# Patient Record
Sex: Male | Born: 1970 | Race: Black or African American | Hispanic: No | Marital: Married | Smoking: Never smoker
Health system: Southern US, Community
[De-identification: ages and names within clinical notes are randomized; demographics above are authoritative.]

## PROBLEM LIST (undated history)

## (undated) DIAGNOSIS — K219 Gastro-esophageal reflux disease without esophagitis: Secondary | ICD-10-CM

## (undated) DIAGNOSIS — G4730 Sleep apnea, unspecified: Secondary | ICD-10-CM

## (undated) DIAGNOSIS — I10 Essential (primary) hypertension: Secondary | ICD-10-CM

## (undated) HISTORY — DX: Essential (primary) hypertension: I10

## (undated) HISTORY — DX: Sleep apnea, unspecified: G47.30

## (undated) HISTORY — DX: Gastro-esophageal reflux disease without esophagitis: K21.9

---

## 1998-05-20 DIAGNOSIS — I10 Essential (primary) hypertension: Secondary | ICD-10-CM

## 1998-05-20 HISTORY — DX: Essential (primary) hypertension: I10

## 2003-05-21 DIAGNOSIS — G4730 Sleep apnea, unspecified: Secondary | ICD-10-CM

## 2003-05-21 HISTORY — DX: Sleep apnea, unspecified: G47.30

## 2007-06-29 ENCOUNTER — Inpatient Hospital Stay (HOSPITAL_COMMUNITY): Admission: EM | Admit: 2007-06-29 | Discharge: 2007-07-01 | Payer: Self-pay | Admitting: Emergency Medicine

## 2007-09-06 ENCOUNTER — Encounter: Admission: RE | Admit: 2007-09-06 | Discharge: 2007-09-06 | Payer: Self-pay | Admitting: Cardiology

## 2009-04-26 IMAGING — CR DG CHEST 1V PORT
1 series · 1 of 1 positions shown · non-contrast
Comparison: none

CLINICAL DATA: Congestion.  Chest pain.  Short of breath. 
 PORTABLE CHEST - 1 VIEW - 06/29/07 AT 0959 HOURS:

[AP]
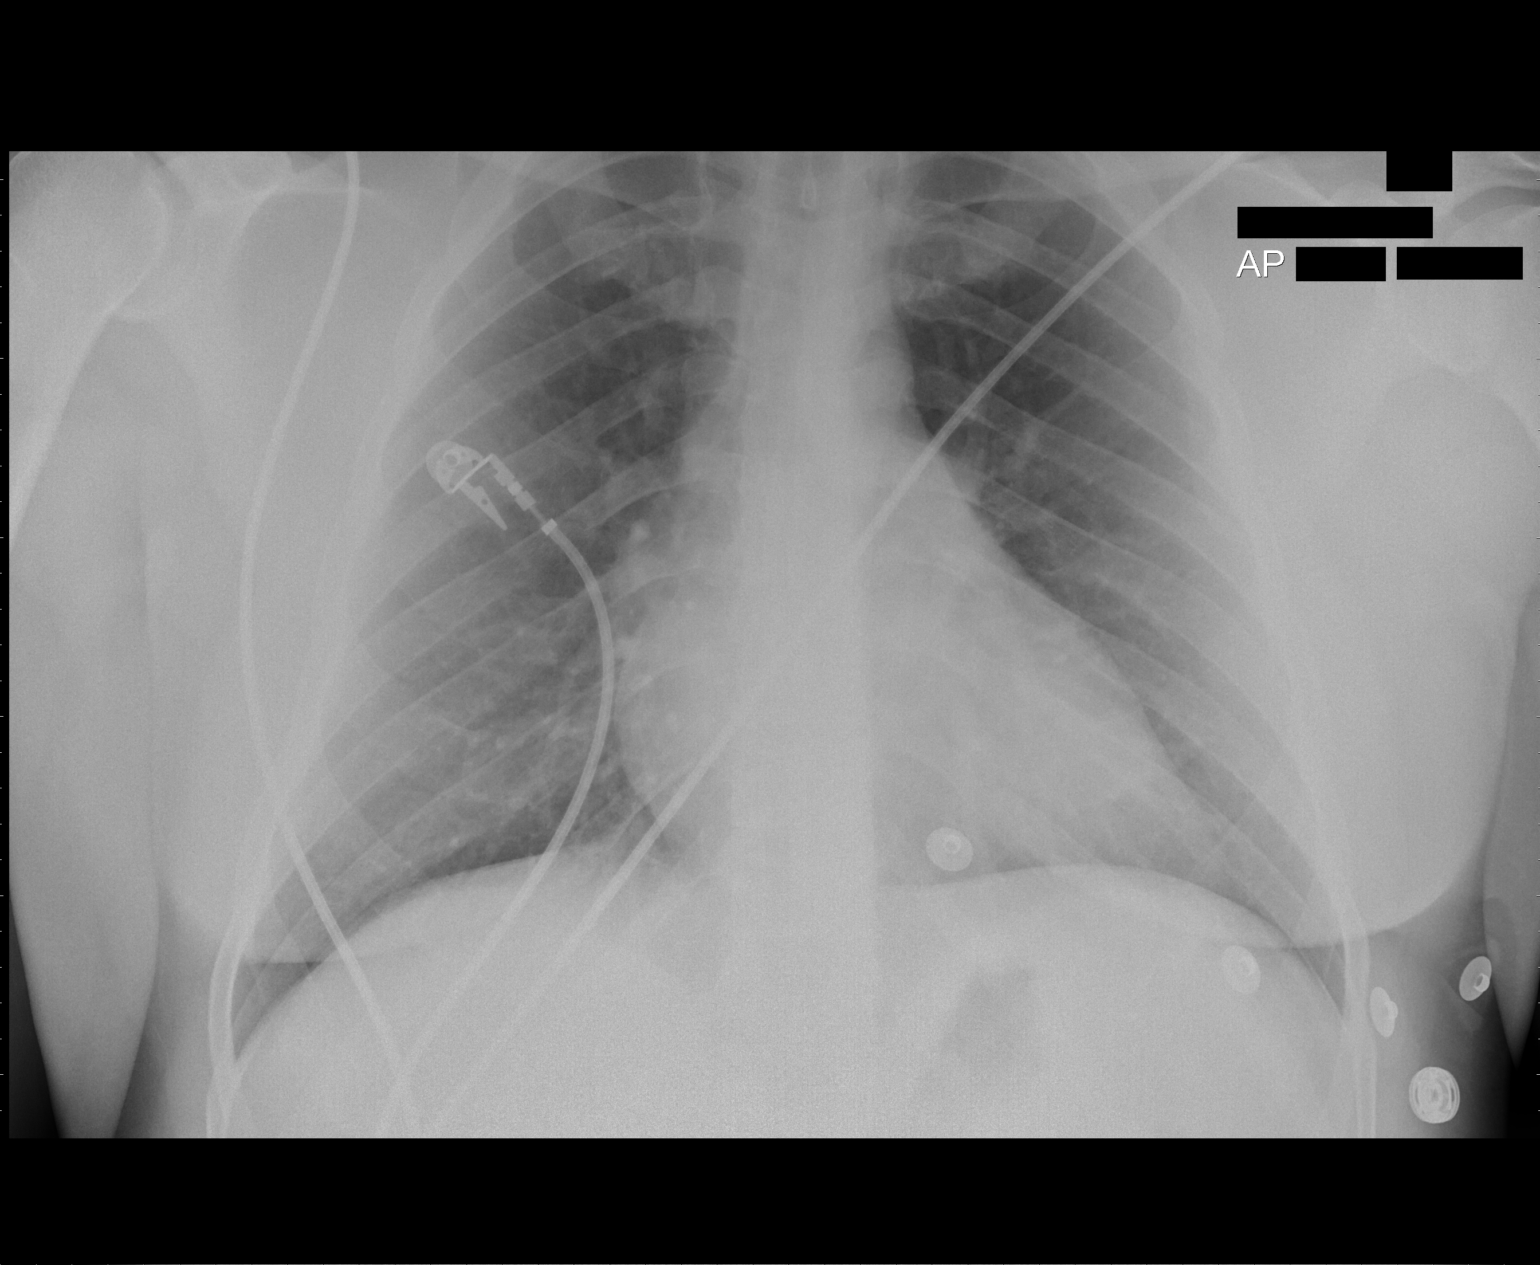

[1 of 1 positions shown; findings below may reference images not displayed]

FINDINGS: The lungs are clear.   The heart is within normal limits in size.  No bony abnormality is seen.
IMPRESSION: No active lung disease.

## 2009-04-27 IMAGING — US US RENAL
1 series · 14 of 25 positions shown · non-contrast
Comparison: none

CLINICAL DATA: Right renal atrophy.  Hypertension. 
 RENAL/URINARY TRACT ULTRASOUND:
TECHNIQUE: Complete ultrasound examination of the urinary tract was performed including evaluation of the kidneys, renal collecting systems, and urinary bladder.

[Series 1: unknown · 0.38mm/px · 14 of 30 slices shown]
[im 1/30]
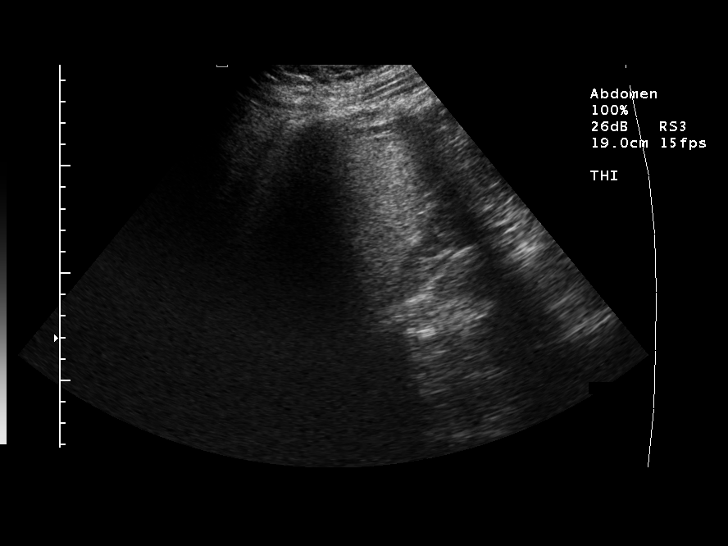
[im 3/30]
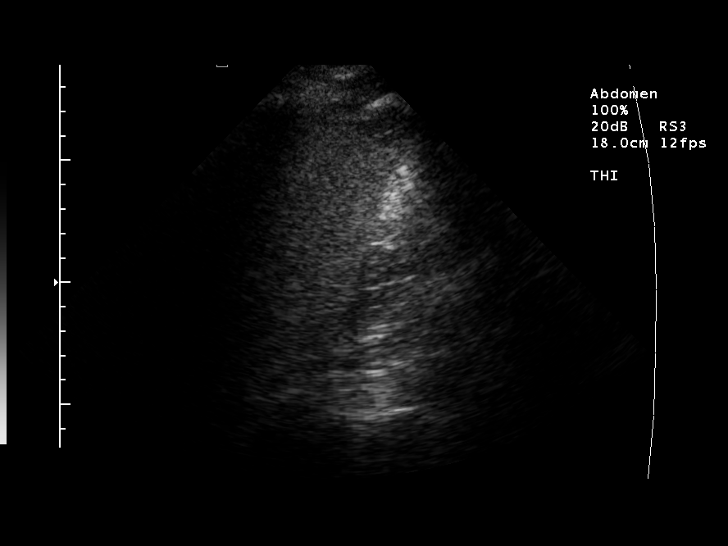
[im 5/30]
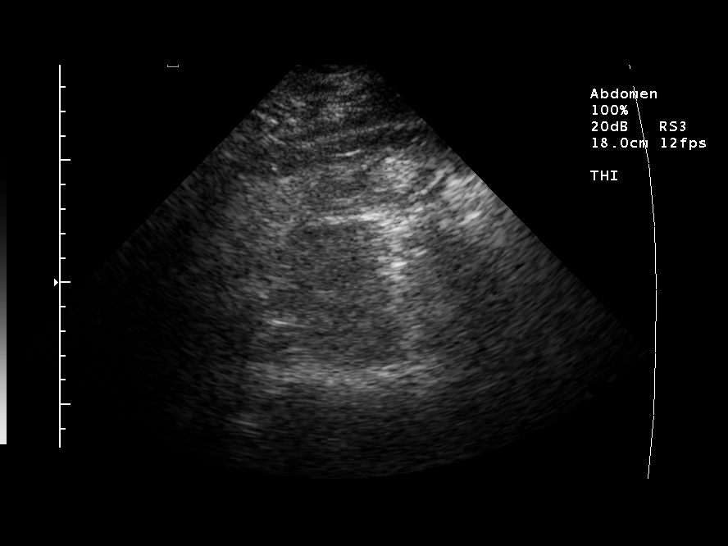
[im 8/30]
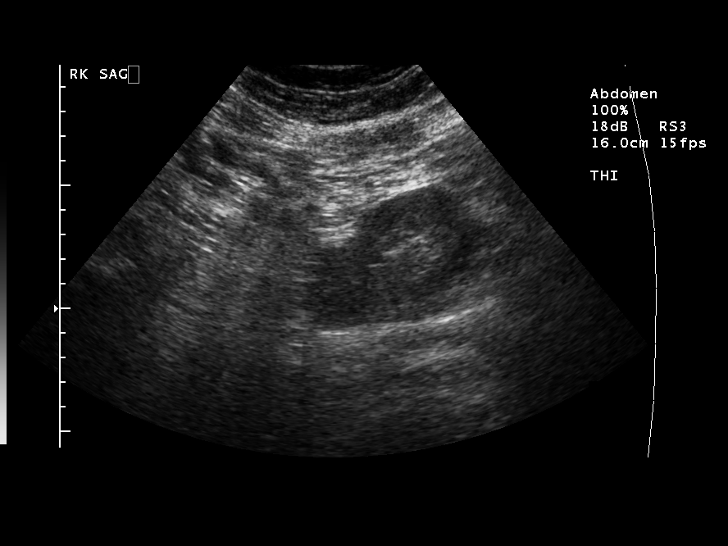
[im 10/30]
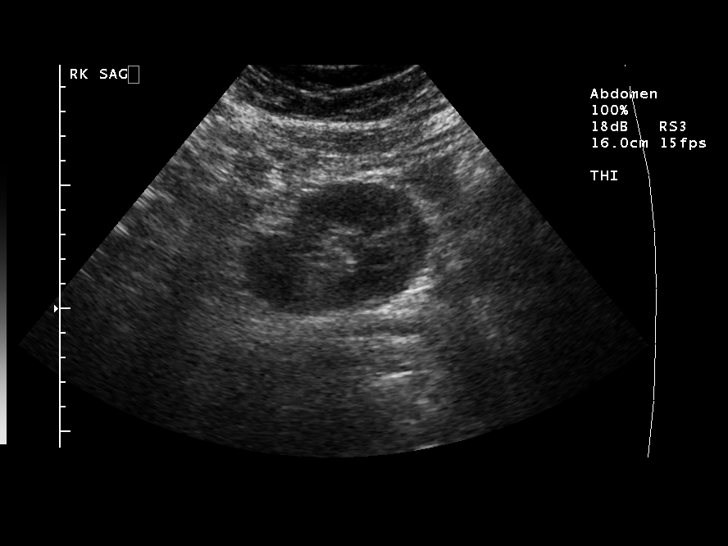
[im 11/30]
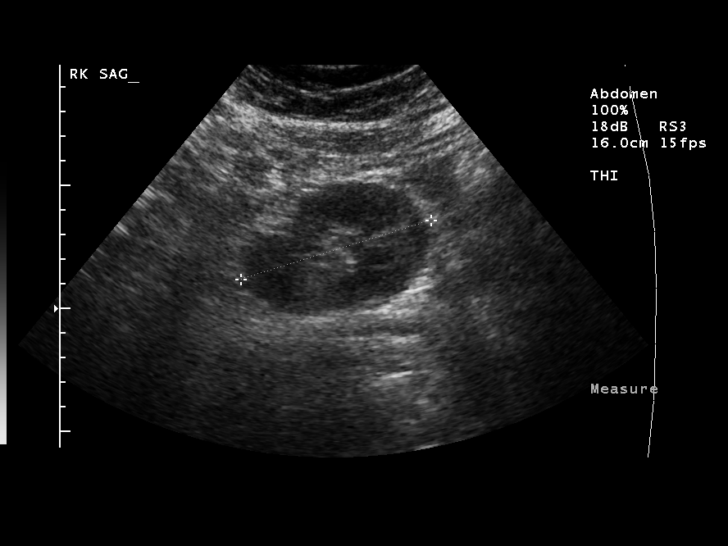
[im 14/30]
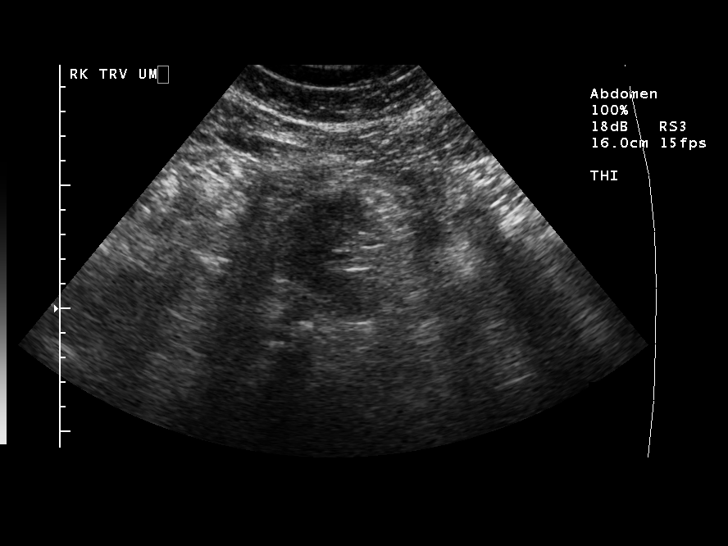
[im 16/30]
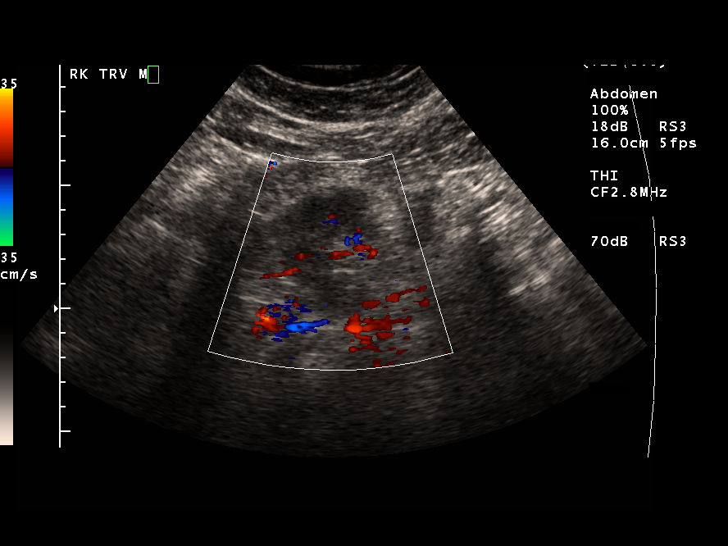
[im 19/30]
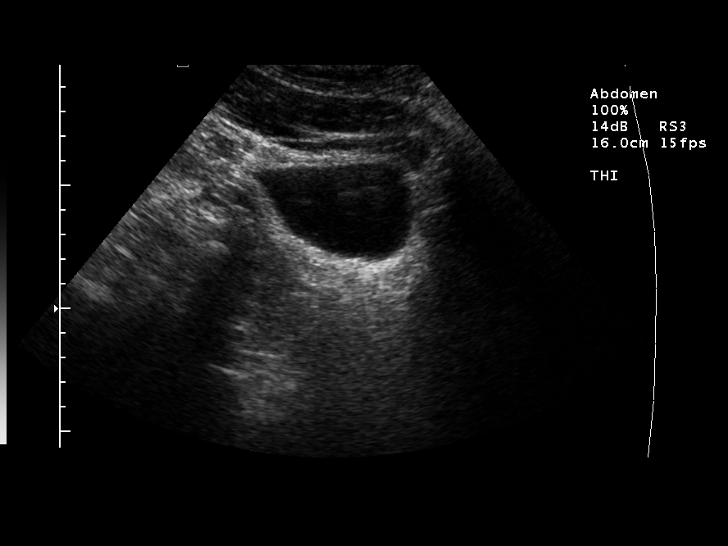
[im 20/30]
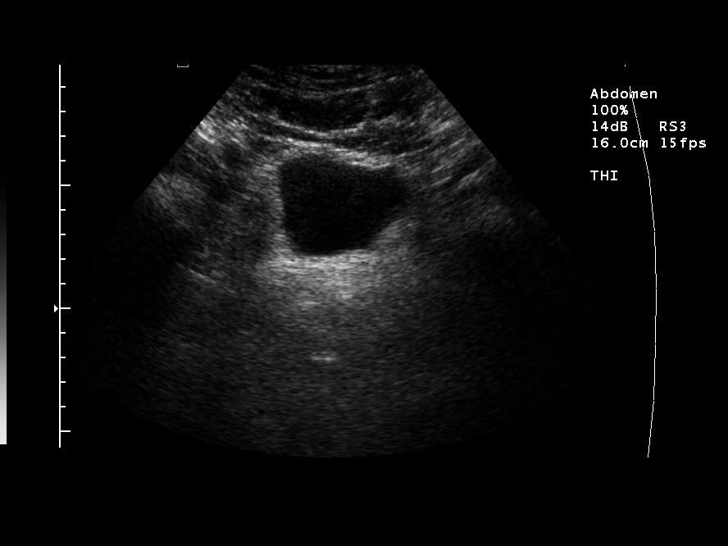
[im 22/30]
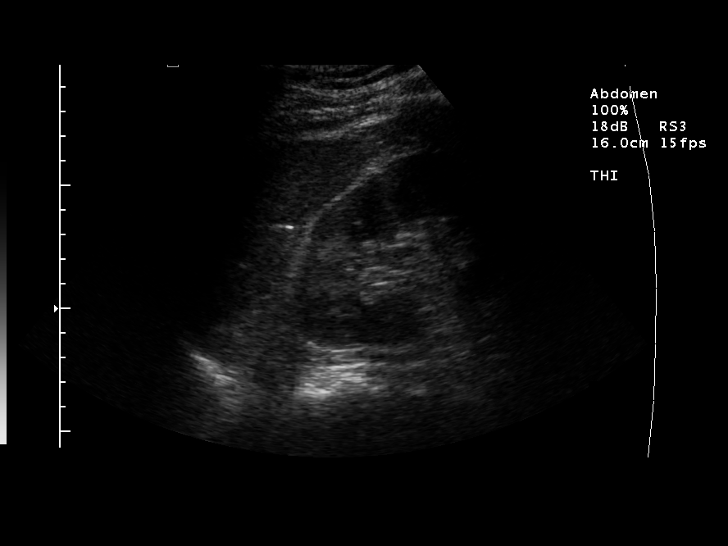
[im 25/30]
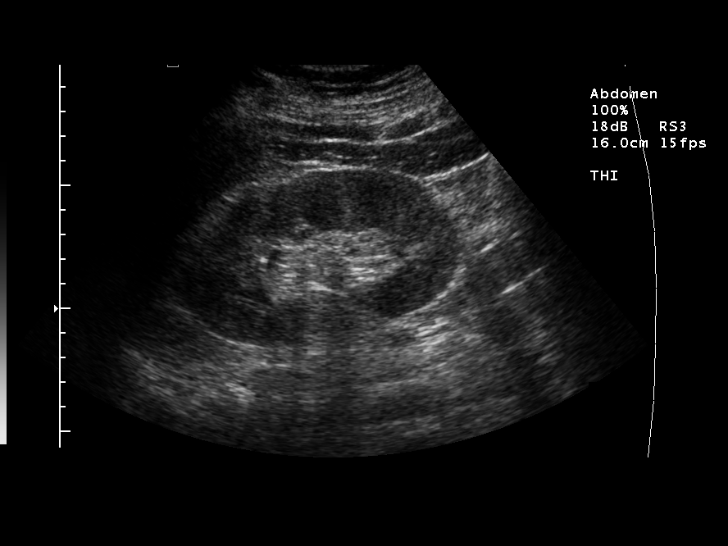
[im 27/30]
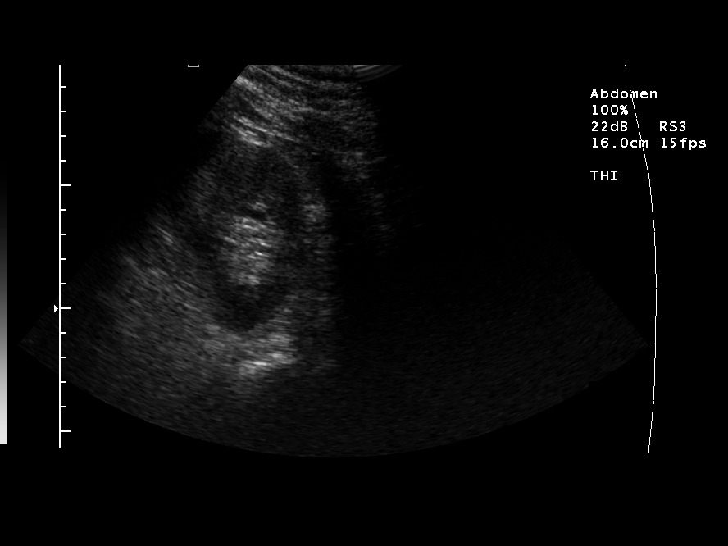
[im 30/30]
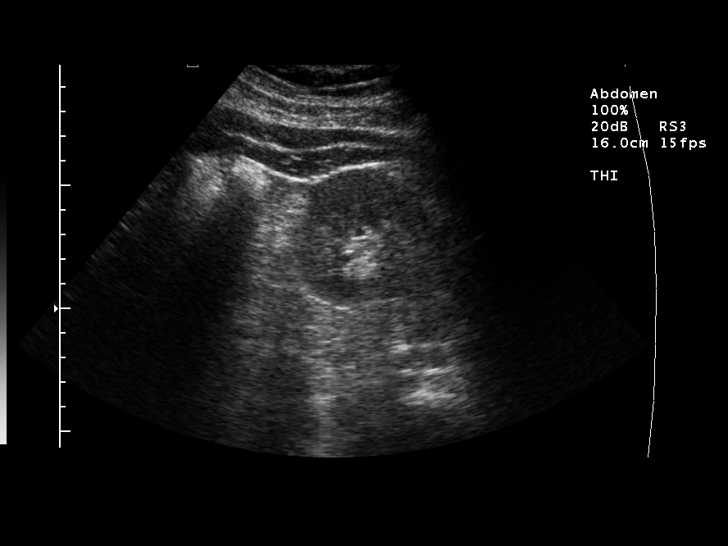

[14 of 25 positions shown; findings below may reference images not displayed]

FINDINGS: The left kidney appears normal in size measuring 11.7 cm in length.  Echogenicity is normal.  No hydronephrosis or focal lesions.  
 The right kidney measures 8.1 cm in length and is present in the right pelvis.  No hydronephrosis or focal lesion. 
 The bladder is unremarkable.
IMPRESSION: Right kidney in a pelvic location.  The organ is slightly small, but shows normal echogenicity and does not show any focal abnormality.  Left kidney appears normal.

## 2009-07-26 ENCOUNTER — Ambulatory Visit: Payer: Self-pay | Admitting: Family Medicine

## 2009-08-09 ENCOUNTER — Ambulatory Visit: Payer: Self-pay | Admitting: Family Medicine

## 2010-10-02 NOTE — Discharge Summary (Signed)
NAME:  Joe Herring, Joe Herring NO.:  [id number]   MEDICAL RECORD NO.:  [id number]          PATIENT TYPE:  INP   LOCATION:  5004                         FACILITY:  [facility]   PHYSICIAN:  Cristy Hilts. Jacinto Halim, MD       DATE OF BIRTH:  12-Dec-1970   DATE OF ADMISSION:  06/29/2007  DATE OF DISCHARGE:  07/02/2007                               DISCHARGE SUMMARY   HISTORY:  40 year old patient, new to Dr. Jacinto Halim, presented to  the emergency room at [facility] on June 29, 2007 secondary to chest pain.  He has no prior cardiac history.  He has been hypertensive since 1996  and presented with chest pain which began the night before, midsternal  chest pressure with radiation to his back that increased with walking  and resolved after rest initially but would come during the night and  awaken him from sleep.  By the morning of June 29, 2007 he was short  of breath.  In the emergency room he was given aspirin, IV nitroglycerin  with relief of his symptoms.  He was pain-free on IV nitroglycerin.  Because of the symptoms, his hypertension, he also has a history of  obstructive sleep apnea but does not wear CPAP, it was felt he should  undergo cardiac catheterization.   He was admitted to the telemetry unit.   PAST MEDICAL HISTORY:  1. Hypertension, treated with Norvasc.  2. He has obstructive sleep apnea with CPAP but only wears it rarely      or he did on admission.   ALLERGIES:  CODEINE AND PERCOCET.  No fish or iodine allergy.  Patient  can take Tylenol without any allergic reaction but no CODEINE.   OUTPATIENT MEDICATIONS:  Norvasc 5 daily.   SOCIAL HISTORY:  Married, three children ages 92, 55 and 67 months.  No  tobacco, alcohol or illicit drugs. He does exercise two days a week.  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   FAMILY HISTORY:  Mother is alive and well.  Father alive with  hypertension.  Two brothers; one with  diabetes, one alive and well.  One  sister alive and well.  No coronary disease in his family .   REVIEW OF SYSTEMS:  See H and P.   PHYSICAL EXAMINATION AT DISCHARGE:  VITAL SIGNS:  Blood pressure 158/98  to 128/75.  Medications adjusted. Pulse 70.  Respirations 18.  Temperature 97.6.  Oxygen saturation on CPAP was 98%.  HEART:  S1, S2, regular rate and rhythm.  LUNGS: Clear.  GROIN:  Stable.   LABORATORY DATA:  Hemoglobin 13.2, hematocrit 41.5, white blood cell  count 5.6, platelet count 219,000, MCV 68.7.  Sodium 132, potassium 4.1,  chloride 102, CO2 26.  BUN 12, creatinine 1.34, glucose 125.  Coag's  with PT 12.9, INR 1.0, PTT 29.  Heparin 0.64.  AST 32, ALT 33, alkaline  phosphatase 113, total bilirubin 1.0, amylase 114, lipase 16, albumin  3.9.   Cardiac enzymes:  CK's were all elevated - 388, 355 and  305 though MB's  were all negative - 1.9, 2.4, 2.2 and troponin I's were all negative -  less than 0.01.   Total cholesterol 112, LDL 56, HDL 39, triglycerides 83.  Calcium 8.8,  magnesium 2.1.   D-dimer less than 0.22.  BNP less than 30.  Glycosylated hemoglobin was  elevated at 7.0.  TSH was within normal limits at 0.639.   Chest x-ray - no active lung disease.   PROCEDURE:  June 30, 2007 combined left heart catheterization by Dr.  Madaline Savage revealing patent coronary arteries, no MR.  Ejection  fraction was approximately 55%.   Please note, he did have an atrophied right kidney when his renal  arteries were looked at.   DISCHARGE MEDICATIONS:  1. Lotrel 5/20, one daily, new medication.  2. Fish oil two capsules daily flow low good cholesterol level.  3. Aspirin 81 mg daily.  4. CPAP at night.   DISCHARGE INSTRUCTIONS:  1. Do not return to work until July 06, 2007.  Work note given.  2. Low sodium, heart-healthy diabetic diet.  3. Increase activity slowly.  May shower or bathe.  No lifting for two      days.  No driving for two days.  4. Wash  right groin cath site with soap and water.  Call if any      bleeding, swelling or drainage.  5. Follow up with Dr. Jacinto Halim in two weeks.  The office will call with      date and time.  6. Follow up with [facility] for your diabetes.  7. You will be scheduled for a renal ultrasound at [facility].   DISCHARGE DIAGNOSES:  1. Chest pain secondary to musculoskeletal with elevated CK's but      negative MB's and patent coronary arteries as well as negative      amylase and lipase.  2. Hypertension, improved control adding ACE inhibitor to his Norvasc.  3. Obstructive sleep apnea.  Use CPAP.  4. New - diabetes mellitus type 2.  To follow up with primary care      physician.  Instructed on diet.  5. Atrophied right kidney, only one functional kidney.  6. Dyslipidemia.      Darcella Gasman. [place], N.P.      Cristy Hilts. Jacinto Halim, MD  Electronically Signed    LRI/MEDQ  D:  07/01/2007  T:  07/02/2007  Job:  [id number]   cc:   [facility]  Madaline Savage, M.D.  [facility]

## 2010-10-02 NOTE — Cardiovascular Report (Signed)
NAME:  Joe Herring, RUNK NO.:  [id number]   MEDICAL RECORD NO.:  [id number]          PATIENT TYPE:  INP   LOCATION:  3728                         FACILITY:  [facility]   PHYSICIAN:  Madaline Savage, M.D.DATE OF BIRTH:  04-26-1971   DATE OF PROCEDURE:  06/30/2007  DATE OF DISCHARGE:                            CARDIAC CATHETERIZATION   PROCEDURES PERFORMED:  1. Selective coronary angiography by Judkins technique.  2. Retrograde left heart catheterization.  3. Left ventricular angiography.  4. Nonselective suprarenal aortography.  5. Selective renal angiography of the right and left kidneys.   INTERVENTIONS:  None.   COMPLICATIONS:  None.   PATIENT PROFILE:  Joe Herring is a 40 year old married African-American  gentleman who was sent to the [facility] emergency room on November 14  for chest pain.  He takes Norvasc as an outpatient and has a history of  hypertension with allergies to Percocet.  During his stay in the  hospital, he had negative enzymes for myocardial infarction.  His EKG  showed sinus rhythm at a rate of 73 a minute with some mild early  repolarization changes of the ST segment in leads V2-V4 with no old  tracings to compare.  The patient presented to the cath lab today for  cardiac catheterization considering his risk factors, which include  obesity, obstructive sleep apnea, abnormal EKG and chest pain.   The procedure was performed electively from the right percutaneous  femoral artery approach.  No complications occurred.  Results are  described below.   Pressures:  Left ventricular pressure was 130/18.  Central aortic  pressure 124/90 with a mean of 105.  Left ventricular end-diastolic  pressure 26.   Angiographic report:  The coronary arteries were angiographically patent  and consisted of a large dominant left circumflex coronary artery,  giving rise to 2 proximal obtuse marginals, then a medium-sized obtuse  marginal branch, then a large  posterior descending branch that  bifurcated distally, followed by yet another posterolateral branch.  The  left anterior descending coronary artery consisted of a single diagonal  branch arising after the first septal perforator, and both diagonal and  LAD were normal.   Right coronary artery was small and nondominant with no evidence of RCA  stenosis.  The left ventricular angiogram showed normal contractility  with ejection fraction estimates at 60%.  There was no evidence of  mitral regurgitation, LV mass or any evidence of aortic root pathology.  Abdominal aorta showed no calcification but did show an atrophic mass in  the area of the right renal artery.  There was  a nephrogram of the left  kidney showing that calyces were present and that the upper ureter  appeared normal and the left renal artery size appeared grossly normal.  The left renal arteries were small and atrophic.  There looked to be 2  renal arteries, and there was never a nephrogram phase for the right  kidney and that kidney appeared atrophic and granular in appearance.  There was selective injection into what was actually a superior  mesenteric, and that vessel and its branches were all  normal.   FINAL IMPRESSIONS:  1. Angiographically patent coronary arteries with a left coronary      dominant system.  2. Normal left ventricular systolic function.  3. Normal left renal artery and normal left renal nephrogram.  4. Atrophic right kidney with what appears to be small dual renal      arteries that were atrophic.   PLAN:  The patient will have a renal ultrasound today and hopefully be  discharged if there is no new information that is noted from his renal  ultrasound.  Follow up with Dr. Yates Decamp.           ______________________________  Madaline Savage, M.D.     WHG/MEDQ  D:  06/30/2007  T:  07/01/2007  Job:  [id number]

## 2011-02-08 LAB — BASIC METABOLIC PANEL
BUN: 12
CO2: 26
Chloride: 102
GFR calc non Af Amer: 60 — ABNORMAL LOW
Glucose, Bld: 125 — ABNORMAL HIGH
Potassium: 4.1
Sodium: 132 — ABNORMAL LOW

## 2011-02-08 LAB — POCT CARDIAC MARKERS
CKMB, poc: 1
Myoglobin, poc: 78.9
Operator id: 151321
Operator id: 288831
Troponin i, poc: <0.05
Troponin i, poc: <0.05

## 2011-02-08 LAB — COMPREHENSIVE METABOLIC PANEL
ALT: 33
AST: 32
Alkaline Phosphatase: 113
CO2: 27
Calcium: 9.4
GFR calc Af Amer: >60
Glucose, Bld: 109 — ABNORMAL HIGH
Potassium: 4.1
Sodium: 140
Total Protein: 7.5

## 2011-02-08 LAB — CK TOTAL AND CKMB (NOT AT ARMC)
CK, MB: 2.2
Total CK: 305 — ABNORMAL HIGH
Total CK: 388 — ABNORMAL HIGH

## 2011-02-08 LAB — DIFFERENTIAL
Basophils Relative: 0
Eosinophils Absolute: 0.1
Eosinophils Relative: 2
Lymphs Abs: 1.6
Monocytes Absolute: 0.4
Neutro Abs: 3
Neutrophils Relative %: 60

## 2011-02-08 LAB — LIPASE, BLOOD: Lipase: 16

## 2011-02-08 LAB — LIPID PANEL
Cholesterol: 112
HDL: 39 — ABNORMAL LOW
LDL Cholesterol: 56
Total CHOL/HDL Ratio: 2.9

## 2011-02-08 LAB — POCT I-STAT CREATININE
Creatinine, Ser: 1.3
Operator id: 288831

## 2011-02-08 LAB — MAGNESIUM: Magnesium: 2.1

## 2011-02-08 LAB — CBC
HCT: 43.5
Hemoglobin: 13.8
MCHC: 31.8
MCHC: 31.9
MCV: 68.4 — ABNORMAL LOW
MCV: 68.7 — ABNORMAL LOW
Platelets: 219
RBC: 6.35 — ABNORMAL HIGH

## 2011-02-08 LAB — HEPARIN LEVEL (UNFRACTIONATED): Heparin Unfractionated: 0.64

## 2011-02-08 LAB — I-STAT 8, (EC8 V) (CONVERTED LAB)
Glucose, Bld: 129 — ABNORMAL HIGH
HCT: 50
TCO2: 27
pCO2, Ven: 44.1 — ABNORMAL LOW
pH, Ven: 7.367 — ABNORMAL HIGH

## 2011-02-08 LAB — APTT: aPTT: 105 — ABNORMAL HIGH

## 2011-02-08 LAB — TSH: TSH: 0.639

## 2011-02-08 LAB — TROPONIN I
Troponin I: <0.01
Troponin I: <0.01

## 2011-02-08 LAB — B-NATRIURETIC PEPTIDE (CONVERTED LAB): Pro B Natriuretic peptide (BNP): <30

## 2011-02-08 LAB — D-DIMER, QUANTITATIVE: D-Dimer, Quant: <0.22

## 2011-03-26 ENCOUNTER — Encounter: Payer: Self-pay | Admitting: Family Medicine

## 2016-04-25 ENCOUNTER — Encounter: Payer: Self-pay | Admitting: Family Medicine

## 2016-05-08 ENCOUNTER — Encounter: Payer: Self-pay | Admitting: Family Medicine

## 2016-05-08 ENCOUNTER — Ambulatory Visit (INDEPENDENT_AMBULATORY_CARE_PROVIDER_SITE_OTHER): Payer: BLUE CROSS/BLUE SHIELD | Admitting: Family Medicine

## 2016-05-08 VITALS — BP 152/68 | HR 78 | Temp 97.6°F | Wt 230.0 lb

## 2016-05-08 DIAGNOSIS — E119 Type 2 diabetes mellitus without complications: Secondary | ICD-10-CM

## 2016-05-08 DIAGNOSIS — Z7689 Persons encountering health services in other specified circumstances: Secondary | ICD-10-CM

## 2016-05-08 DIAGNOSIS — I10 Essential (primary) hypertension: Secondary | ICD-10-CM | POA: Diagnosis not present

## 2016-05-08 LAB — CBC WITH DIFFERENTIAL/PLATELET
BASOS ABS: 0 {cells}/uL (ref 0–200)
Basophils Relative: 0 %
EOS PCT: 2 %
Eosinophils Absolute: 98 cells/uL (ref 15–500)
HEMATOCRIT: 46.5 % (ref 38.5–50.0)
HEMOGLOBIN: 14.4 g/dL (ref 13.2–17.1)
LYMPHS ABS: 2107 {cells}/uL (ref 850–3900)
LYMPHS PCT: 43 %
MCH: 21.6 pg — AB (ref 27.0–33.0)
MCHC: 31 g/dL — AB (ref 32.0–36.0)
MCV: 69.8 fL — ABNORMAL LOW (ref 80.0–100.0)
MPV: 9.8 fL (ref 7.5–12.5)
Monocytes Absolute: 294 cells/uL (ref 200–950)
Monocytes Relative: 6 %
NEUTROS PCT: 49 %
Neutro Abs: 2401 cells/uL (ref 1500–7800)
Platelets: 189 10*3/uL (ref 140–400)
RBC: 6.66 MIL/uL — ABNORMAL HIGH (ref 4.20–5.80)
RDW: 16.1 % — AB (ref 11.0–15.0)
WBC: 4.9 10*3/uL (ref 3.8–10.8)

## 2016-05-08 LAB — COMPLETE METABOLIC PANEL WITH GFR
ALBUMIN: 4.2 g/dL (ref 3.6–5.1)
ALK PHOS: 108 U/L (ref 40–115)
ALT: 18 U/L (ref 9–46)
AST: 22 U/L (ref 10–40)
BUN: 11 mg/dL (ref 7–25)
CALCIUM: 9.2 mg/dL (ref 8.6–10.3)
CO2: 26 mmol/L (ref 20–31)
Chloride: 104 mmol/L (ref 98–110)
Creat: 1.34 mg/dL (ref 0.60–1.35)
GFR, EST NON AFRICAN AMERICAN: 63 mL/min (ref >=60)
GFR, Est African American: 73 mL/min (ref >=60)
Glucose, Bld: 158 mg/dL — ABNORMAL HIGH (ref 65–99)
POTASSIUM: 4.7 mmol/L (ref 3.5–5.3)
SODIUM: 140 mmol/L (ref 135–146)
Total Bilirubin: 0.6 mg/dL (ref 0.2–1.2)
Total Protein: 7.1 g/dL (ref 6.1–8.1)

## 2016-05-08 LAB — LIPID PANEL
CHOL/HDL RATIO: 1.9 ratio (ref <5.0)
CHOLESTEROL: 94 mg/dL (ref <200)
HDL: 49 mg/dL (ref >40)
LDL Cholesterol: 34 mg/dL (ref <100)
Triglycerides: 54 mg/dL (ref <150)
VLDL: 11 mg/dL (ref <30)

## 2016-05-08 LAB — TSH: TSH: 0.5 mIU/L (ref 0.40–4.50)

## 2016-05-08 MED ORDER — ASPIRIN EC 81 MG PO TBEC: 81.0000 mg | DELAYED_RELEASE_TABLET | Freq: Every day | ORAL | 3 refills | Status: DC

## 2016-05-08 NOTE — Progress Notes (Signed)
Subjective:    Patient ID: Joe Herring, male    DOB: 09-24-1970, 45 y.o.   MRN: [id number]  CC: Patient establishing care  Well Adult Physical  Patient here for a comprehensive physical exam.The patient reports no problems  Do you take any herbs or supplements that were not prescribed by a doctor? no  Are you taking calcium supplements? no  Are you taking aspirin daily? no  GU History: Patient did not receive prostate care outside our clinic Date last prostate exam: N/A, patient <50 Date last PSA: N/A   Family history: Family History  Problem Relation Age of Onset  . Hypertension Father   . Cancer Maternal Grandmother   . Diabetes Maternal Grandmother   . Hypertension Maternal Grandmother   . Alcohol abuse Maternal Aunt    @FAM @  Screening Cancer Colon Cancer- No Breast Cancer-No Cervical cancer-No Prostate cancer- Father and uncle Lung cancer- Grandmother Skin Cancer-No  Chronic dis HTN-Father AAA-No Diabetes-Grandmother Hyperlipidemia-No Osteoporosis-No  Infection HCV-No STI-No  Psych/Social Depression-No EtOH abuse-No Tobacco use-No Drug use: No Work: Exercise: weight lifting  Diet: Regular, generally balanced Sexual activity: married, monogamy with wife Birth control: n/a Intimate partner violence: feels safe in relationship Review of Systems  Constitutional: Negative.   HENT: Positive for tinnitus.   Eyes: Negative.   Respiratory: Negative.   Cardiovascular: Negative.   Gastrointestinal: Negative.   Endocrine: Negative.   Genitourinary: Negative.   Musculoskeletal: Negative.   Skin: Negative.   Allergic/Immunologic: Negative.   Neurological: Negative.   Hematological: Negative.   Psychiatric/Behavioral: Negative.    Objective:   Physical Exam Vitals:   05/08/16 0855  BP: (!) 158/72  Pulse: 78  Temp: 97.6 F (36.4 C)  TempSrc: Oral  SpO2: 98%  Weight: 230 lb (104.3 kg)    General: Alert and oriented. Pleasant and  cooperative. Well-nourished and well-developed.  Head: Normocephalic and atraumatic. Eyes: Without icterus, sclera clear and conjunctiva pink.  Ears: Normal auditory acuity. TMs and ear canals normal Cardiovascular: S1, S2 present without murmurs. Extremities without clubbing or edema. Respiratory: Clear to auscultation bilaterally. No wheezes, rales, or rhonchi. No distress.  Gastrointestinal: +BS, soft, non-tender and non-distended. No HSM noted. No guarding or rebound. No masses appreciated.  Genitourinary: Deferred  Musculoskalatal: Symmetrical without gross deformities. Skin: Intact without significant lesions or rashes. Neurologic: Alert and oriented x4; grossly normal. Psych: Alert and cooperative. Normal mood and affect. Heme/Lymph/Immune: No excessive bruising noted     Assessment & Plan:   #Health Maintenance/ Establishing Care with practice Patient is new to the practice and appears to be in good health at presentation with no major concerns except for occasional tinnitus, and current weight. Patient is 45 yo, with a previous diagnosis of T2DM in 2009 with a A1c of 7.0. Patient was started on metformin but has not taken it since 2015. Patient also diagnosed with hypertension and was on amlodipine until recently when he stopped taking it. Today BP is elevated at 158/72 with a repeat BP 152/68.  Patient was previously hospitalized in 2009 [facility] at [facility] for chest pain and had extensive workup including catheterization that was essentially within normal limit. Patient is currently 230 lb and would like to lose more weight and understand with diabetes and hypertension it is primordial to control his weight. Patient will increase physical activity and plan to modify diet to better fit current therapy goals. Patient has concerned with prostate cancer but discussed screening guidelines with good understanding from the patient. I will order  baseline lab work today and will tailor  treatment plan based on results.  --Will order CBC, CMP, Lipid panel, TSH and A1c --Will start patient on 81 mg ASA given cardiac history, risks factor and age --Will restart metformin 1000 mg pending A1c --Will initially start patient on 10 mg daily, with BP check in 10 days and titration as needed --Patient will also benefit from statin therapy given cardiac history, and comorbidity. Will start patient on statin therapy and assess optimal dosage and drug based on lipid panel results.

## 2016-05-08 NOTE — Patient Instructions (Addendum)
It was great seeing you today! We have addressed the following issues today  1. We will check basic lab work today ( CBC, CMP, A1c, TSH, Lipid panel ). I will call you tomorrow with the results. 2. I will start you today on aspirin 81 mg once a day 3. Make an appointment to see each other in about a month at the front desk.   If we did any lab work today, and the results require attention, either me or my nurse will get in touch with you. If everything is normal, you will get a letter in mail. If you don't hear from us in two weeks, please give us a call. Otherwise, we look forward to seeing you again at your next visit. If you have any questions or concerns before then, please call the clinic at [phone].   Please bring all your medications to every doctors visit   Sign up for My Chart to have easy access to your labs results, and communication with your Primary care physician.     Please check-out at the front desk before leaving the clinic.    Take Care,

## 2016-05-09 LAB — HEMOGLOBIN A1C
Hgb A1c MFr Bld: 7.4 % — ABNORMAL HIGH (ref <5.7)
Mean Plasma Glucose: 166 mg/dL

## 2016-05-09 MED ORDER — METFORMIN HCL 1000 MG PO TABS: 1000.0000 mg | ORAL_TABLET | Freq: Every day | ORAL | 3 refills | Status: DC

## 2016-05-09 MED ORDER — LISINOPRIL 20 MG PO TABS: 20.0000 mg | ORAL_TABLET | Freq: Every day | ORAL | 3 refills | Status: DC

## 2016-05-09 NOTE — Addendum Note (Signed)
Addended by: Lovena NeighboursIALLO, Noor Witte F on: 05/09/2016 04:36 PM   Modules accepted: Orders

## 2016-05-10 ENCOUNTER — Telehealth: Payer: Self-pay | Admitting: *Deleted

## 2016-05-10 NOTE — Telephone Encounter (Signed)
Patient made aware of results and already has a follow up appointment with PCP in January.  Jazmin Hartsell,CMA

## 2016-05-10 NOTE — Telephone Encounter (Signed)
-----   Message from Joe NeighboursAbdoulaye Diallo, MD sent at 05/09/2016  4:34 PM EST ----- Joe MechHey Joe Herring, Please could you let Joe Herring, [name] know that we got the results of the blood work we ordered yesterday. I tried to call him but it went to voicemail. His lipid panel (i.e. Cholesterol) is within the normal range so we will not start him on a statin as yet. I already send metformin 1000 mg to his pharmacy he will take that once a day since his A1c is 7.4 slightly up from 7.0 (8 years ago). I also started him on Lisinopril 20 mg once a day for his elevated blood pressure. His TSH (thyroid), electrolytes and CBC are essentially fine. We will talk about them more at our next visit.  Thank you  Joe NeighboursAbdoulaye Diallo, MD

## 2016-06-13 ENCOUNTER — Ambulatory Visit: Payer: Self-pay | Admitting: Family Medicine

## 2016-07-03 DIAGNOSIS — H524 Presbyopia: Secondary | ICD-10-CM | POA: Diagnosis not present

## 2016-07-12 ENCOUNTER — Ambulatory Visit: Payer: Self-pay | Admitting: Family Medicine

## 2016-08-13 ENCOUNTER — Ambulatory Visit: Payer: Self-pay | Admitting: Family Medicine

## 2016-09-27 ENCOUNTER — Ambulatory Visit: Payer: Self-pay | Admitting: Family Medicine

## 2016-10-29 ENCOUNTER — Ambulatory Visit: Payer: Self-pay | Admitting: Family Medicine

## 2017-07-07 ENCOUNTER — Ambulatory Visit (INDEPENDENT_AMBULATORY_CARE_PROVIDER_SITE_OTHER): Payer: BLUE CROSS/BLUE SHIELD | Admitting: Internal Medicine

## 2017-07-07 ENCOUNTER — Encounter: Payer: Self-pay | Admitting: Internal Medicine

## 2017-07-07 ENCOUNTER — Other Ambulatory Visit: Payer: Self-pay

## 2017-07-07 VITALS — BP 178/94 | HR 93 | Temp 98.7°F | Wt 227.0 lb

## 2017-07-07 DIAGNOSIS — E118 Type 2 diabetes mellitus with unspecified complications: Secondary | ICD-10-CM

## 2017-07-07 DIAGNOSIS — I10 Essential (primary) hypertension: Secondary | ICD-10-CM | POA: Diagnosis not present

## 2017-07-07 DIAGNOSIS — R058 Other specified cough: Secondary | ICD-10-CM

## 2017-07-07 DIAGNOSIS — R05 Cough: Secondary | ICD-10-CM | POA: Diagnosis not present

## 2017-07-07 MED ORDER — BENZONATATE 100 MG PO CAPS: 100.0000 mg | ORAL_CAPSULE | Freq: Three times a day (TID) | ORAL | 0 refills | Status: DC | PRN

## 2017-07-07 MED ORDER — AZELASTINE HCL 0.1 % NA SOLN: 1.0000 | Freq: Two times a day (BID) | NASAL | 0 refills | Status: DC

## 2017-07-07 MED ORDER — LISINOPRIL 20 MG PO TABS: 20.0000 mg | ORAL_TABLET | Freq: Every day | ORAL | 3 refills | Status: DC

## 2017-07-07 NOTE — Patient Instructions (Addendum)
Please stop taking the mucinex that you have been using. As I think it is contributing to your blood pressure being high Please restart Lisinopril for your blood pressure. Please follow up in clinic in 1 week. We will check some blood work today  You can try the nasal spray and Tessalon as needed for your cough and also try tea with honey. The cough can take up to 3 weeks to fully resolve.  We will check your A1c today.   Please follow up in 1 week or so for your blood pressure

## 2017-07-07 NOTE — Progress Notes (Signed)
   [facility] Phone: [phone]   Date of Visit: 07/07/2017   HPI:  Cough: - patient reports of fevers, body aches and cough that began last Tuesday. Fever up to 101F. Fevers resolved on Friday.  -His most bothersome symptom is his cough.  Reports producing a little phlegm. -Has been using Mucinex fast max with last dose taken last night -Has also been using TheraFlu -No shortness of breath, chest pain, headache, blurred vision -Of note his blood pressure is elevated today.  Hypertension: -Has not been seen in clinic since December 2017 when he establish care here -At that time he was started on lisinopril for his blood pressures.  Reports that he has not gotten a refill since that time. -As noted above no shortness of breath, chest pain, headache or blurred vision  Type 2 diabetes: -Last A1c checked was in December 2017 which was 7.4 -At the last visit patient was restarted on metformin 1000 mg twice daily but he ran out of this (this was in December 2017) -No polyuria polydipsia  ROS: See HPI.  PMFSH: Past medical history: Hypertension, diabetes  PHYSICAL EXAM: BP (!) 178/94   Pulse 93   Temp 98.7 F (37.1 C) (Oral)   Wt 227 lb (103 kg)   SpO2 97%  GEN: NAD HEENT: Atraumatic, normocephalic, neck supple, EOMI, sclera clear, tympanic membranes normal bilaterally, oropharynx normal appearing CV: RRR, no murmurs, rubs, or gallops PULM: CTAB, normal effort SKIN: No rash or cyanosis; warm and well-perfused EXTR: No lower extremity edema or calf tenderness PSYCH: Mood and affect euthymic, normal rate and volume of speech NEURO: Awake, alert, no focal deficits grossly, normal speech   ASSESSMENT/PLAN:  Post viral cough syndrome: Fevers have resolved.  No signs or symptoms of pneumonia.  Likely post viral cough.  Discussed cough can last up to 3 weeks.  Tessalon as needed and as a lasting nasal spray for postnasal drip  Type 2 diabetes mellitus  (HCC) Repeat A1c today which will be a send out.  Will await results prior to restarting any medications for diabetes.  Patient is to follow-up to talk about diabetes in more detail at some point  Essential hypertension, benign Has but not been on medications for months.  Will start lisinopril 20 mg today.  BMP today.  Palma HolterKanishka G Arlinda Barcelona, MD PGY 3 [facility]

## 2017-07-08 LAB — HEMOGLOBIN A1C
Est. average glucose Bld gHb Est-mCnc: 252 mg/dL
HEMOGLOBIN A1C: 10.4 % — AB (ref 4.8–5.6)

## 2017-07-08 LAB — BASIC METABOLIC PANEL
BUN/Creatinine Ratio: 7 — ABNORMAL LOW (ref 9–20)
BUN: 10 mg/dL (ref 6–24)
CHLORIDE: 98 mmol/L (ref 96–106)
CO2: 25 mmol/L (ref 20–29)
CREATININE: 1.53 mg/dL — AB (ref 0.76–1.27)
Calcium: 9 mg/dL (ref 8.7–10.2)
GFR calc Af Amer: 62 mL/min/{1.73_m2} (ref >59)
GFR calc non Af Amer: 53 mL/min/{1.73_m2} — ABNORMAL LOW (ref >59)
GLUCOSE: 228 mg/dL — AB (ref 65–99)
Potassium: 4.5 mmol/L (ref 3.5–5.2)
Sodium: 139 mmol/L (ref 134–144)

## 2017-07-09 ENCOUNTER — Telehealth: Payer: Self-pay | Admitting: Internal Medicine

## 2017-07-09 ENCOUNTER — Encounter: Payer: Self-pay | Admitting: Internal Medicine

## 2017-07-09 DIAGNOSIS — E119 Type 2 diabetes mellitus without complications: Secondary | ICD-10-CM | POA: Insufficient documentation

## 2017-07-09 DIAGNOSIS — I10 Essential (primary) hypertension: Secondary | ICD-10-CM | POA: Insufficient documentation

## 2017-07-09 MED ORDER — METFORMIN HCL 1000 MG PO TABS: 1000.0000 mg | ORAL_TABLET | Freq: Two times a day (BID) | ORAL | 0 refills | Status: DC

## 2017-07-09 NOTE — Telephone Encounter (Signed)
Attempted to call patient but went to voicemail. Left message to call back.   Please inform patient that his hemoglobin A1c for his diabetes is higher this month at 10.4 from 7.4. Our goal A1c for him is less than 7. Please ask him to take Metformin 1000mg  twice a day (I will resend prescription). Please ask him to follow up in 1 week as we discussed at the visit.

## 2017-07-09 NOTE — Assessment & Plan Note (Signed)
Repeat A1c today which will be a send out.  Will await results prior to restarting any medications for diabetes.  Patient is to follow-up to talk about diabetes in more detail at some point

## 2017-07-09 NOTE — Assessment & Plan Note (Signed)
Has but not been on medications for months.  Will start lisinopril 20 mg today.  BMP today.

## 2017-07-10 NOTE — Telephone Encounter (Signed)
Attempted to contact pt and his wife. Neither party answered, VM left on both to return call to our office.

## 2017-08-03 ENCOUNTER — Other Ambulatory Visit: Payer: Self-pay | Admitting: Internal Medicine

## 2017-11-05 ENCOUNTER — Encounter (HOSPITAL_COMMUNITY): Payer: Self-pay | Admitting: Emergency Medicine

## 2017-11-05 ENCOUNTER — Ambulatory Visit (HOSPITAL_COMMUNITY)
Admission: EM | Admit: 2017-11-05 | Discharge: 2017-11-05 | Disposition: A | Discharge status: Home / Self Care | Payer: BLUE CROSS/BLUE SHIELD | Attending: Internal Medicine | Admitting: Internal Medicine

## 2017-11-05 DIAGNOSIS — I160 Hypertensive urgency: Secondary | ICD-10-CM | POA: Diagnosis not present

## 2017-11-05 DIAGNOSIS — E782 Mixed hyperlipidemia: Secondary | ICD-10-CM

## 2017-11-05 DIAGNOSIS — E1165 Type 2 diabetes mellitus with hyperglycemia: Secondary | ICD-10-CM

## 2017-11-05 DIAGNOSIS — R030 Elevated blood-pressure reading, without diagnosis of hypertension: Secondary | ICD-10-CM

## 2017-11-05 MED ORDER — AMLODIPINE BESYLATE 5 MG PO TABS: 5.0000 mg | ORAL_TABLET | Freq: Every day | ORAL | 0 refills | Status: DC

## 2017-11-05 MED ORDER — ATORVASTATIN CALCIUM 40 MG PO TABS: 40.0000 mg | ORAL_TABLET | Freq: Every day | ORAL | 0 refills | Status: DC

## 2017-11-05 MED ORDER — CLONIDINE HCL 0.1 MG PO TABS
0.1000 mg | ORAL_TABLET | Freq: Once | ORAL | Status: AC
  Administered 2017-11-05: 0.1 mg via ORAL

## 2017-11-05 MED ORDER — CLONIDINE HCL 0.1 MG PO TABS
ORAL_TABLET | ORAL | Status: AC
  Filled 2017-11-05: qty 1

## 2017-11-05 MED ORDER — ASPIRIN EC 81 MG PO TBEC: 81.0000 mg | DELAYED_RELEASE_TABLET | Freq: Every day | ORAL | 0 refills | Status: DC

## 2017-11-05 NOTE — ED Triage Notes (Signed)
Pt here for htn; pt sts hx of same but not taking meds currently

## 2017-11-05 NOTE — ED Provider Notes (Signed)
MRN: [id number] DOB: 12-27-1970  Subjective:   Joe Herring is a 47 y.o. male presenting for follow up on Hypertension.  Patient is not currently taking his lisinopril, states that it caused a chronic cough and he stopped this back in March.  He did not notify his PCP.  States that he has not seen his PCP in over a year.  He is also supposed to be on diabetes medication which he is not taking.  States that he does not like [place] medicine and is trying homeopathic medications.  Patient's diet is noncompliant.  However, he does exercise regularly.  Denies dizziness, chronic headache, blurred vision, chest pain, shortness of breath, heart racing, palpitations, nausea, vomiting, abdominal pain, hematuria, lower leg swelling. Denies smoking cigarettes.  Denies history of MI, heart disease, chronic kidney disease.   No current facility-administered medications for this encounter.    Current Outpatient Medications  Medication Sig Dispense Refill  . aspirin EC 81 MG tablet Take 1 tablet (81 mg total) by mouth daily. 30 tablet 3  . azelastine (ASTELIN) 0.1 % nasal spray PLACE 1 SPRAY INTO BOTH NOSTRILS 2 (TWO) TIMES DAILY AS DIRECTED 30 mL 0  . benzonatate (TESSALON) 100 MG capsule Take 1 capsule (100 mg total) by mouth 3 (three) times daily as needed for cough. 10 capsule 0  . lisinopril (PRINIVIL,ZESTRIL) 20 MG tablet Take 1 tablet (20 mg total) by mouth daily. (Patient not taking: Reported on 11/05/2017) 90 tablet 3  . metFORMIN (GLUCOPHAGE) 1000 MG tablet Take 1 tablet (1,000 mg total) by mouth 2 (two) times daily with a meal. 180 tablet 0  . omeprazole (PRILOSEC) 20 MG capsule Take 20 mg by mouth daily.        Joe Herring is allergic to percocet [oxycodone-acetaminophen] and tylenol with codeine [acetaminophen-codeine].  Joe Herring  has a past medical history of Diabetes mellitus, GERD (gastroesophageal reflux disease), Hypertension (2000), and Sleep apnea (2005). Denies past surgical history.   Family  History  Problem Relation Age of Onset  . Hypertension Father   . Cancer Maternal Grandmother   . Diabetes Maternal Grandmother   . Hypertension Maternal Grandmother   . Alcohol abuse Maternal Aunt      Objective:   Vitals: BP (!) 218/136 (BP Location: Left Arm)   Pulse 85   Temp 98.5 F (36.9 C) (Oral)   Resp 18   SpO2 97%   BP Readings from Last 3 Encounters:  11/05/17 (!) 218/136  07/07/17 (!) 178/94  05/08/16 (!) 152/68    Physical Exam  Constitutional: He is oriented to person, place, and time. He appears well-developed and well-nourished.  HENT:  Mouth/Throat: Oropharynx is clear and moist.  Eyes: Pupils are equal, round, and reactive to light. EOM are normal. No scleral icterus.  Neck: Normal range of motion. Neck supple. No thyromegaly present.  Cardiovascular: Normal rate, regular rhythm and intact distal pulses. Exam reveals no gallop and no friction rub.  No murmur heard. Pulmonary/Chest: No respiratory distress. He has no wheezes. He has no rales.  Musculoskeletal: He exhibits no edema.  Neurological: He is alert and oriented to person, place, and time. He displays normal reflexes. No cranial nerve deficit. Coordination normal.  Skin: Skin is warm and dry.  Psychiatric: He has a normal mood and affect.   Assessment and Plan :   Hypertensive urgency  Elevated blood pressure reading  Uncontrolled type 2 diabetes mellitus with hyperglycemia (HCC)  Mixed hyperlipidemia  Counseled patient on need for medical compliance.  He  is agreeable to restarting amlodipine and atorvastatin.  Patient will also set up an appointment to establish care for management of his chronic illnesses with me at my primary care clinic.  ER precautions reviewed.  Wallis BambergMario Luwana Butrick, PA-C Primary Care at [facility] [phone] 11/05/2017  4:14 PM    Wallis BambergMani, Emet Rafanan, PA-C 11/05/17 1707

## 2017-11-05 NOTE — Discharge Instructions (Signed)
NO SALT. NO SODIUM. If you develop headache, chest pain, difficulty breathing, belly pain, blood in your urine, then report to the ER immediately.  Wallis BambergMario Haeven Nickle, PA-C [facility] at [facility] [address], [place], [place] [address] [phone] Please call and set up an appointment for this Friday, 11/07/2017

## 2017-11-06 ENCOUNTER — Ambulatory Visit (INDEPENDENT_AMBULATORY_CARE_PROVIDER_SITE_OTHER): Payer: BLUE CROSS/BLUE SHIELD | Admitting: Family Medicine

## 2017-11-06 ENCOUNTER — Other Ambulatory Visit: Payer: Self-pay

## 2017-11-06 ENCOUNTER — Encounter: Payer: Self-pay | Admitting: Family Medicine

## 2017-11-06 VITALS — BP 210/112 | HR 82 | Temp 98.3°F | Wt 224.0 lb

## 2017-11-06 DIAGNOSIS — I10 Essential (primary) hypertension: Secondary | ICD-10-CM | POA: Diagnosis not present

## 2017-11-06 DIAGNOSIS — E119 Type 2 diabetes mellitus without complications: Secondary | ICD-10-CM

## 2017-11-06 LAB — POCT GLYCOSYLATED HEMOGLOBIN (HGB A1C): HBA1C, POC (CONTROLLED DIABETIC RANGE): 8.1 % — AB (ref 0.0–7.0)

## 2017-11-06 MED ORDER — AMLODIPINE BESYLATE 5 MG PO TABS: 5.0000 mg | ORAL_TABLET | Freq: Every day | ORAL | 3 refills | Status: DC

## 2017-11-06 NOTE — Progress Notes (Signed)
   Subjective:    Patient ID: Joe Herring is a [age] y.o. male presenting with Hypertension and Diabetes  on 11/06/2017  HPI: Has long standing HTN. Has had to stop his lisinopril, due to cough. Went to dentist yesterday and noted to have severely elevated BP. Went to urgent care yesterday and started on Norvasc 5. Has been on this before, but lost his insurance and then went off this.  Also with DM, trying to manage with diet and exercise.  Review of Systems  Constitutional: Negative for chills and fever.  Respiratory: Negative for shortness of breath.   Cardiovascular: Negative for leg swelling.  Gastrointestinal: Negative for abdominal pain, nausea and vomiting.      Objective:    BP (!) 210/112   Pulse 82   Temp 98.3 F (36.8 C) (Oral)   Wt 224 lb (101.6 kg)   SpO2 99%  Physical Exam  Constitutional: He appears well-developed and well-nourished. No distress.  HENT:  Head: Normocephalic and atraumatic.  Eyes: No scleral icterus.  Neck: Neck supple.  Cardiovascular: Normal rate.  Pulmonary/Chest: Effort normal.  Abdominal: Soft.  Musculoskeletal: He exhibits no edema.  Neurological: He is alert.  Skin: Skin is warm.  Psychiatric: He has a normal mood and affect.  Vitals reviewed.   A1C is 8.1    Assessment & Plan:   Problem List Items Addressed This Visit      Unprioritized   Essential hypertension, benign    Has tried and failed ACE-I. Tolerated Norvasc in the past. Has only had 2 days on meds, not enough to see a difference. Some dietary indiscretions may have contributed to elevation. Will increase this dosing. May take 2 until he runs out. Check BP at home. Watch salt intake. Return for recheck in 2 wks. Warning signs reviewed.      Relevant Medications   amLODipine (NORVASC) 5 MG tablet   Type 2 diabetes mellitus (HCC) - Primary    Continue to work on diet and exercise. He may require meds, but has just in the past month, really started focusing on this.      Relevant Orders   HgB A1c (Completed)      Total face-to-face time with patient: 15 minutes. Over 50% of encounter was spent on counseling and coordination of care. Return in 2 weeks (on 11/20/2017).  Reva Boresanya S Pratt 11/06/2017 3:28 PM

## 2017-11-06 NOTE — Patient Instructions (Signed)
Hypertension Hypertension, commonly called high blood pressure, is when the force of blood pumping through the arteries is too strong. The arteries are the blood vessels that carry blood from the heart throughout the body. Hypertension forces the heart to work harder to pump blood and may cause arteries to become narrow or stiff. Having untreated or uncontrolled hypertension can cause heart attacks, strokes, kidney disease, and other problems. A blood pressure reading consists of a higher number over a lower number. Ideally, your blood pressure should be below 120/80. The first ("top") number is called the systolic pressure. It is a measure of the pressure in your arteries as your heart beats. The second ("bottom") number is called the diastolic pressure. It is a measure of the pressure in your arteries as the heart relaxes. What are the causes? The cause of this condition is not known. What increases the risk? Some risk factors for high blood pressure are under your control. Others are not. Factors you can change  Smoking.  Having type 2 diabetes mellitus, high cholesterol, or both.  Not getting enough exercise or physical activity.  Being overweight.  Having too much fat, sugar, calories, or salt (sodium) in your diet.  Drinking too much alcohol. Factors that are difficult or impossible to change  Having chronic kidney disease.  Having a family history of high blood pressure.  Age. Risk increases with age.  Race. You may be at higher risk if you are African-American.  Gender. Men are at higher risk than women before age 45. After age 65, women are at higher risk than men.  Having obstructive sleep apnea.  Stress. What are the signs or symptoms? Extremely high blood pressure (hypertensive crisis) may cause:  Headache.  Anxiety.  Shortness of breath.  Nosebleed.  Nausea and vomiting.  Severe chest pain.  Jerky movements you cannot control (seizures).  How is this  diagnosed? This condition is diagnosed by measuring your blood pressure while you are seated, with your arm resting on a surface. The cuff of the blood pressure monitor will be placed directly against the skin of your upper arm at the level of your heart. It should be measured at least twice using the same arm. Certain conditions can cause a difference in blood pressure between your right and left arms. Certain factors can cause blood pressure readings to be lower or higher than normal (elevated) for a short period of time:  When your blood pressure is higher when you are in a health care provider's office than when you are at home, this is called white coat hypertension. Most people with this condition do not need medicines.  When your blood pressure is higher at home than when you are in a health care provider's office, this is called masked hypertension. Most people with this condition may need medicines to control blood pressure.  If you have a high blood pressure reading during one visit or you have normal blood pressure with other risk factors:  You may be asked to return on a different day to have your blood pressure checked again.  You may be asked to monitor your blood pressure at home for 1 week or longer.  If you are diagnosed with hypertension, you may have other blood or imaging tests to help your health care provider understand your overall risk for other conditions. How is this treated? This condition is treated by making healthy lifestyle changes, such as eating healthy foods, exercising more, and reducing your alcohol intake. Your   health care provider may prescribe medicine if lifestyle changes are not enough to get your blood pressure under control, and if:  Your systolic blood pressure is above 130.  Your diastolic blood pressure is above 80.  Your personal target blood pressure may vary depending on your medical conditions, your age, and other factors. Follow these  instructions at home: Eating and drinking  Eat a diet that is high in fiber and potassium, and low in sodium, added sugar, and fat. An example eating plan is called the DASH (Dietary Approaches to Stop Hypertension) diet. To eat this way: ? Eat plenty of fresh fruits and vegetables. Try to fill half of your plate at each meal with fruits and vegetables. ? Eat whole grains, such as whole wheat pasta, brown rice, or whole grain bread. Fill about one quarter of your plate with whole grains. ? Eat or drink low-fat dairy products, such as skim milk or low-fat yogurt. ? Avoid fatty cuts of meat, processed or cured meats, and poultry with skin. Fill about one quarter of your plate with lean proteins, such as fish, chicken without skin, beans, eggs, and tofu. ? Avoid premade and processed foods. These tend to be higher in sodium, added sugar, and fat.  Reduce your daily sodium intake. Most people with hypertension should eat less than 1,500 mg of sodium a day.  Limit alcohol intake to no more than 1 drink a day for nonpregnant women and 2 drinks a day for men. One drink equals 12 oz of beer, 5 oz of wine, or 1 oz of hard liquor. Lifestyle  Work with your health care provider to maintain a healthy body weight or to lose weight. Ask what an ideal weight is for you.  Get at least 30 minutes of exercise that causes your heart to beat faster (aerobic exercise) most days of the week. Activities may include walking, swimming, or biking.  Include exercise to strengthen your muscles (resistance exercise), such as pilates or lifting weights, as part of your weekly exercise routine. Try to do these types of exercises for 30 minutes at least 3 days a week.  Do not use any products that contain nicotine or tobacco, such as cigarettes and e-cigarettes. If you need help quitting, ask your health care provider.  Monitor your blood pressure at home as told by your health care provider.  Keep all follow-up visits as  told by your health care provider. This is important. Medicines  Take over-the-counter and prescription medicines only as told by your health care provider. Follow directions carefully. Blood pressure medicines must be taken as prescribed.  Do not skip doses of blood pressure medicine. Doing this puts you at risk for problems and can make the medicine less effective.  Ask your health care provider about side effects or reactions to medicines that you should watch for. Contact a health care provider if:  You think you are having a reaction to a medicine you are taking.  You have headaches that keep coming back (recurring).  You feel dizzy.  You have swelling in your ankles.  You have trouble with your vision. Get help right away if:  You develop a severe headache or confusion.  You have unusual weakness or numbness.  You feel faint.  You have severe pain in your chest or abdomen.  You vomit repeatedly.  You have trouble breathing. Summary  Hypertension is when the force of blood pumping through your arteries is too strong. If this condition is not   controlled, it may put you at risk for serious complications.  Your personal target blood pressure may vary depending on your medical conditions, your age, and other factors. For most people, a normal blood pressure is less than 120/80.  Hypertension is treated with lifestyle changes, medicines, or a combination of both. Lifestyle changes include weight loss, eating a healthy, low-sodium diet, exercising more, and limiting alcohol. This information is not intended to replace advice given to you by your health care provider. Make sure you discuss any questions you have with your health care provider. Document Released: 05/06/2005 Document Revised: 04/03/2016 Document Reviewed: 04/03/2016 Elsevier Interactive Patient Education  2018 [employer] DASH Eating Plan DASH stands for "Dietary Approaches to Stop Hypertension." The DASH  eating plan is a healthy eating plan that has been shown to reduce high blood pressure (hypertension). It may also reduce your risk for type 2 diabetes, heart disease, and stroke. The DASH eating plan may also help with weight loss. What are tips for following this plan? General guidelines  Avoid eating more than 2,300 mg (milligrams) of salt (sodium) a day. If you have hypertension, you may need to reduce your sodium intake to 1,500 mg a day.  Limit alcohol intake to no more than 1 drink a day for nonpregnant women and 2 drinks a day for men. One drink equals 12 oz of beer, 5 oz of wine, or 1 oz of hard liquor.  Work with your health care provider to maintain a healthy body weight or to lose weight. Ask what an ideal weight is for you.  Get at least 30 minutes of exercise that causes your heart to beat faster (aerobic exercise) most days of the week. Activities may include walking, swimming, or biking.  Work with your health care provider or diet and nutrition specialist (dietitian) to adjust your eating plan to your individual calorie needs. Reading food labels  Check food labels for the amount of sodium per serving. Choose foods with less than 5 percent of the Daily Value of sodium. Generally, foods with less than 300 mg of sodium per serving fit into this eating plan.  To find whole grains, look for the word "whole" as the first word in the ingredient list. Shopping  Buy products labeled as "low-sodium" or "no salt added."  Buy fresh foods. Avoid canned foods and premade or frozen meals. Cooking  Avoid adding salt when cooking. Use salt-free seasonings or herbs instead of table salt or sea salt. Check with your health care provider or pharmacist before using salt substitutes.  Do not fry foods. Cook foods using healthy methods such as baking, boiling, grilling, and broiling instead.  Cook with heart-healthy oils, such as olive, canola, soybean, or sunflower oil. Meal  planning   Eat a balanced diet that includes: ? 5 or more servings of fruits and vegetables each day. At each meal, try to fill half of your plate with fruits and vegetables. ? Up to 6-8 servings of whole grains each day. ? Less than 6 oz of lean meat, poultry, or fish each day. A 3-oz serving of meat is about the same size as a deck of cards. One egg equals 1 oz. ? 2 servings of low-fat dairy each day. ? A serving of nuts, seeds, or beans 5 times each week. ? Heart-healthy fats. Healthy fats called Omega-3 fatty acids are found in foods such as flaxseeds and coldwater fish, like sardines, salmon, and mackerel.  Limit how much you eat of the   following: ? Canned or prepackaged foods. ? Food that is high in trans fat, such as fried foods. ? Food that is high in saturated fat, such as fatty meat. ? Sweets, desserts, sugary drinks, and other foods with added sugar. ? Full-fat dairy products.  Do not salt foods before eating.  Try to eat at least 2 vegetarian meals each week.  Eat more home-cooked food and less restaurant, buffet, and fast food.  When eating at a restaurant, ask that your food be prepared with less salt or no salt, if possible. What foods are recommended? The items listed may not be a complete list. Talk with your dietitian about what dietary choices are best for you. Grains Whole-grain or whole-wheat bread. Whole-grain or whole-wheat pasta. Brown rice. Oatmeal. Quinoa. Bulgur. Whole-grain and low-sodium cereals. Pita bread. Low-fat, low-sodium crackers. Whole-wheat flour tortillas. Vegetables Fresh or frozen vegetables (raw, steamed, roasted, or grilled). Low-sodium or reduced-sodium tomato and vegetable juice. Low-sodium or reduced-sodium tomato sauce and tomato paste. Low-sodium or reduced-sodium canned vegetables. Fruits All fresh, dried, or frozen fruit. Canned fruit in natural juice (without added sugar). Meat and other protein foods Skinless chicken or turkey.  Ground chicken or turkey. Pork with fat trimmed off. Fish and seafood. Egg whites. Dried beans, peas, or lentils. Unsalted nuts, nut butters, and seeds. Unsalted canned beans. Lean cuts of beef with fat trimmed off. Low-sodium, lean deli meat. Dairy Low-fat (1%) or fat-free (skim) milk. Fat-free, low-fat, or reduced-fat cheeses. Nonfat, low-sodium ricotta or cottage cheese. Low-fat or nonfat yogurt. Low-fat, low-sodium cheese. Fats and oils Soft margarine without trans fats. Vegetable oil. Low-fat, reduced-fat, or light mayonnaise and salad dressings (reduced-sodium). Canola, safflower, olive, soybean, and sunflower oils. Avocado. Seasoning and other foods Herbs. Spices. Seasoning mixes without salt. Unsalted popcorn and pretzels. Fat-free sweets. What foods are not recommended? The items listed may not be a complete list. Talk with your dietitian about what dietary choices are best for you. Grains Baked goods made with fat, such as croissants, muffins, or some breads. Dry pasta or rice meal packs. Vegetables Creamed or fried vegetables. Vegetables in a cheese sauce. Regular canned vegetables (not low-sodium or reduced-sodium). Regular canned tomato sauce and paste (not low-sodium or reduced-sodium). Regular tomato and vegetable juice (not low-sodium or reduced-sodium). Pickles. Olives. Fruits Canned fruit in a light or heavy syrup. Fried fruit. Fruit in cream or butter sauce. Meat and other protein foods Fatty cuts of meat. Ribs. Fried meat. Bacon. Sausage. Bologna and other processed lunch meats. Salami. Fatback. Hotdogs. Bratwurst. Salted nuts and seeds. Canned beans with added salt. Canned or smoked fish. Whole eggs or egg yolks. Chicken or turkey with skin. Dairy Whole or 2% milk, cream, and half-and-half. Whole or full-fat cream cheese. Whole-fat or sweetened yogurt. Full-fat cheese. Nondairy creamers. Whipped toppings. Processed cheese and cheese spreads. Fats and oils Butter. Stick  margarine. Lard. Shortening. Ghee. Bacon fat. Tropical oils, such as coconut, palm kernel, or palm oil. Seasoning and other foods Salted popcorn and pretzels. Onion salt, garlic salt, seasoned salt, table salt, and sea salt. Worcestershire sauce. Tartar sauce. Barbecue sauce. Teriyaki sauce. Soy sauce, including reduced-sodium. Steak sauce. Canned and packaged gravies. Fish sauce. Oyster sauce. Cocktail sauce. Horseradish that you find on the shelf. Ketchup. Mustard. Meat flavorings and tenderizers. Bouillon cubes. Hot sauce and Tabasco sauce. Premade or packaged marinades. Premade or packaged taco seasonings. Relishes. Regular salad dressings. Where to find more information:  National Heart, Lung, and Blood Institute: www.nhlbi.nih.gov  American Heart Association: www.heart.org   Summary  The DASH eating plan is a healthy eating plan that has been shown to reduce high blood pressure (hypertension). It may also reduce your risk for type 2 diabetes, heart disease, and stroke.  With the DASH eating plan, you should limit salt (sodium) intake to 2,300 mg a day. If you have hypertension, you may need to reduce your sodium intake to 1,500 mg a day.  When on the DASH eating plan, aim to eat more fresh fruits and vegetables, whole grains, lean proteins, low-fat dairy, and heart-healthy fats.  Work with your health care provider or diet and nutrition specialist (dietitian) to adjust your eating plan to your individual calorie needs. This information is not intended to replace advice given to you by your health care provider. Make sure you discuss any questions you have with your health care provider. Document Released: 04/25/2011 Document Revised: 04/29/2016 Document Reviewed: 04/29/2016 Elsevier Interactive Patient Education  2018 Elsevier Inc.  

## 2017-11-08 ENCOUNTER — Encounter: Payer: Self-pay | Admitting: Family Medicine

## 2017-11-08 NOTE — Assessment & Plan Note (Signed)
Has tried and failed ACE-I. Tolerated Norvasc in the past. Has only had 2 days on meds, not enough to see a difference. Some dietary indiscretions may have contributed to elevation. Will increase this dosing. May take 2 until he runs out. Check BP at home. Watch salt intake. Return for recheck in 2 wks. Warning signs reviewed.

## 2017-11-08 NOTE — Assessment & Plan Note (Signed)
Continue to work on diet and exercise. He may require meds, but has just in the past month, really started focusing on this.

## 2017-11-19 ENCOUNTER — Other Ambulatory Visit: Payer: Self-pay

## 2017-11-19 ENCOUNTER — Ambulatory Visit (INDEPENDENT_AMBULATORY_CARE_PROVIDER_SITE_OTHER): Payer: BLUE CROSS/BLUE SHIELD | Admitting: Family Medicine

## 2017-11-19 ENCOUNTER — Encounter: Payer: Self-pay | Admitting: Family Medicine

## 2017-11-19 DIAGNOSIS — I10 Essential (primary) hypertension: Secondary | ICD-10-CM | POA: Diagnosis not present

## 2017-11-19 MED ORDER — HYDROCHLOROTHIAZIDE 12.5 MG PO CAPS: 12.5000 mg | ORAL_CAPSULE | Freq: Every day | ORAL | 2 refills | Status: DC

## 2017-11-19 MED ORDER — AMLODIPINE BESYLATE 10 MG PO TABS: 10.0000 mg | ORAL_TABLET | Freq: Every day | ORAL | 2 refills | Status: DC

## 2017-11-19 NOTE — Patient Instructions (Signed)
DASH Eating Plan DASH stands for "Dietary Approaches to Stop Hypertension." The DASH eating plan is a healthy eating plan that has been shown to reduce high blood pressure (hypertension). It may also reduce your risk for type 2 diabetes, heart disease, and stroke. The DASH eating plan may also help with weight loss. What are tips for following this plan? General guidelines  Avoid eating more than 2,300 mg (milligrams) of salt (sodium) a day. If you have hypertension, you may need to reduce your sodium intake to 1,500 mg a day.  Limit alcohol intake to no more than 1 drink a day for nonpregnant women and 2 drinks a day for men. One drink equals 12 oz of beer, 5 oz of wine, or 1 oz of hard liquor.  Work with your health care provider to maintain a healthy body weight or to lose weight. Ask what an ideal weight is for you.  Get at least 30 minutes of exercise that causes your heart to beat faster (aerobic exercise) most days of the week. Activities may include walking, swimming, or biking.  Work with your health care provider or diet and nutrition specialist (dietitian) to adjust your eating plan to your individual calorie needs. Reading food labels  Check food labels for the amount of sodium per serving. Choose foods with less than 5 percent of the Daily Value of sodium. Generally, foods with less than 300 mg of sodium per serving fit into this eating plan.  To find whole grains, look for the word "whole" as the first word in the ingredient list. Shopping  Buy products labeled as "low-sodium" or "no salt added."  Buy fresh foods. Avoid canned foods and premade or frozen meals. Cooking  Avoid adding salt when cooking. Use salt-free seasonings or herbs instead of table salt or sea salt. Check with your health care provider or pharmacist before using salt substitutes.  Do not fry foods. Cook foods using healthy methods such as baking, boiling, grilling, and broiling instead.  Cook with  heart-healthy oils, such as olive, canola, soybean, or sunflower oil. Meal planning   Eat a balanced diet that includes: ? 5 or more servings of fruits and vegetables each day. At each meal, try to fill half of your plate with fruits and vegetables. ? Up to 6-8 servings of whole grains each day. ? Less than 6 oz of lean meat, poultry, or fish each day. A 3-oz serving of meat is about the same size as a deck of cards. One egg equals 1 oz. ? 2 servings of low-fat dairy each day. ? A serving of nuts, seeds, or beans 5 times each week. ? Heart-healthy fats. Healthy fats called Omega-3 fatty acids are found in foods such as flaxseeds and coldwater fish, like sardines, salmon, and mackerel.  Limit how much you eat of the following: ? Canned or prepackaged foods. ? Food that is high in trans fat, such as fried foods. ? Food that is high in saturated fat, such as fatty meat. ? Sweets, desserts, sugary drinks, and other foods with added sugar. ? Full-fat dairy products.  Do not salt foods before eating.  Try to eat at least 2 vegetarian meals each week.  Eat more home-cooked food and less restaurant, buffet, and fast food.  When eating at a restaurant, ask that your food be prepared with less salt or no salt, if possible. What foods are recommended? The items listed may not be a complete list. Talk with your dietitian about what   dietary choices are best for you. Grains Whole-grain or whole-wheat bread. Whole-grain or whole-wheat pasta. Brown rice. Oatmeal. Quinoa. Bulgur. Whole-grain and low-sodium cereals. Pita bread. Low-fat, low-sodium crackers. Whole-wheat flour tortillas. Vegetables Fresh or frozen vegetables (raw, steamed, roasted, or grilled). Low-sodium or reduced-sodium tomato and vegetable juice. Low-sodium or reduced-sodium tomato sauce and tomato paste. Low-sodium or reduced-sodium canned vegetables. Fruits All fresh, dried, or frozen fruit. Canned fruit in natural juice (without  added sugar). Meat and other protein foods Skinless chicken or turkey. Ground chicken or turkey. Pork with fat trimmed off. Fish and seafood. Egg whites. Dried beans, peas, or lentils. Unsalted nuts, nut butters, and seeds. Unsalted canned beans. Lean cuts of beef with fat trimmed off. Low-sodium, lean deli meat. Dairy Low-fat (1%) or fat-free (skim) milk. Fat-free, low-fat, or reduced-fat cheeses. Nonfat, low-sodium ricotta or cottage cheese. Low-fat or nonfat yogurt. Low-fat, low-sodium cheese. Fats and oils Soft margarine without trans fats. Vegetable oil. Low-fat, reduced-fat, or light mayonnaise and salad dressings (reduced-sodium). Canola, safflower, olive, soybean, and sunflower oils. Avocado. Seasoning and other foods Herbs. Spices. Seasoning mixes without salt. Unsalted popcorn and pretzels. Fat-free sweets. What foods are not recommended? The items listed may not be a complete list. Talk with your dietitian about what dietary choices are best for you. Grains Baked goods made with fat, such as croissants, muffins, or some breads. Dry pasta or rice meal packs. Vegetables Creamed or fried vegetables. Vegetables in a cheese sauce. Regular canned vegetables (not low-sodium or reduced-sodium). Regular canned tomato sauce and paste (not low-sodium or reduced-sodium). Regular tomato and vegetable juice (not low-sodium or reduced-sodium). Pickles. Olives. Fruits Canned fruit in a light or heavy syrup. Fried fruit. Fruit in cream or butter sauce. Meat and other protein foods Fatty cuts of meat. Ribs. Fried meat. Bacon. Sausage. Bologna and other processed lunch meats. Salami. Fatback. Hotdogs. Bratwurst. Salted nuts and seeds. Canned beans with added salt. Canned or smoked fish. Whole eggs or egg yolks. Chicken or turkey with skin. Dairy Whole or 2% milk, cream, and half-and-half. Whole or full-fat cream cheese. Whole-fat or sweetened yogurt. Full-fat cheese. Nondairy creamers. Whipped toppings.  Processed cheese and cheese spreads. Fats and oils Butter. Stick margarine. Lard. Shortening. Ghee. Bacon fat. Tropical oils, such as coconut, palm kernel, or palm oil. Seasoning and other foods Salted popcorn and pretzels. Onion salt, garlic salt, seasoned salt, table salt, and sea salt. Worcestershire sauce. Tartar sauce. Barbecue sauce. Teriyaki sauce. Soy sauce, including reduced-sodium. Steak sauce. Canned and packaged gravies. Fish sauce. Oyster sauce. Cocktail sauce. Horseradish that you find on the shelf. Ketchup. Mustard. Meat flavorings and tenderizers. Bouillon cubes. Hot sauce and Tabasco sauce. Premade or packaged marinades. Premade or packaged taco seasonings. Relishes. Regular salad dressings. Where to find more information:  National Heart, Lung, and Blood Institute: www.nhlbi.nih.gov  American Heart Association: www.heart.org Summary  The DASH eating plan is a healthy eating plan that has been shown to reduce high blood pressure (hypertension). It may also reduce your risk for type 2 diabetes, heart disease, and stroke.  With the DASH eating plan, you should limit salt (sodium) intake to 2,300 mg a day. If you have hypertension, you may need to reduce your sodium intake to 1,500 mg a day.  When on the DASH eating plan, aim to eat more fresh fruits and vegetables, whole grains, lean proteins, low-fat dairy, and heart-healthy fats.  Work with your health care provider or diet and nutrition specialist (dietitian) to adjust your eating plan to your individual   calorie needs. This information is not intended to replace advice given to you by your health care provider. Make sure you discuss any questions you have with your health care provider. Document Released: 04/25/2011 Document Revised: 04/29/2016 Document Reviewed: 04/29/2016 Elsevier Interactive Patient Education  2018 [employer]  

## 2017-11-19 NOTE — Progress Notes (Signed)
   Subjective:    Patient ID: Bethann HumbleWillie Degner is a 47 y.o. male presenting with Hypertension  on 11/19/2017  HPI: Returns for f/u today. Taking Norvasc 10 and BP is improved. Working on diet, exercise. BP at home is going down. Taking garlic and apple cider vinegar. Taking Norvasc and lipitor q am.  Review of Systems  Constitutional: Negative for chills and fever.  Respiratory: Negative for shortness of breath.   Cardiovascular: Negative for leg swelling.  Gastrointestinal: Negative for abdominal pain, nausea and vomiting.      Objective:    BP (!) 158/88   Pulse 90   Temp 98.3 F (36.8 C) (Oral)   Wt 217 lb (98.4 kg)   SpO2 98%  Physical Exam  Constitutional: He appears well-developed and well-nourished. No distress.  HENT:  Head: Normocephalic and atraumatic.  Eyes: No scleral icterus.  Neck: Neck supple.  Cardiovascular: Normal rate.  Pulmonary/Chest: Effort normal.  Abdominal: Soft.  Musculoskeletal: He exhibits no edema.  Neurological: He is alert.  Skin: Skin is warm.  Psychiatric: He has a normal mood and affect.  Vitals reviewed.       Assessment & Plan:   Problem List Items Addressed This Visit      Unprioritized   Essential hypertension, benign    BP is improved but still not at goal. Will continue Norvasc and add HCTZ. He has tolerated this in the past. Notes that some have trouble with ED once being place on anti-hypertensives and if this is a problem for him, we will address.      Relevant Medications   hydrochlorothiazide (MICROZIDE) 12.5 MG capsule   amLODipine (NORVASC) 10 MG tablet      Total face-to-face time with patient: 15 minutes. Over 50% of encounter was spent on counseling and coordination of care. Return in about 3 months (around 02/19/2018).  Reva Boresanya S Maddix Kliewer 11/19/2017 2:37 PM

## 2017-11-21 ENCOUNTER — Encounter: Payer: Self-pay | Admitting: Family Medicine

## 2017-11-21 ENCOUNTER — Other Ambulatory Visit: Payer: Self-pay | Admitting: Family Medicine

## 2017-11-21 DIAGNOSIS — I10 Essential (primary) hypertension: Secondary | ICD-10-CM

## 2017-11-21 MED ORDER — OLMESARTAN MEDOXOMIL 5 MG PO TABS: 5.0000 mg | ORAL_TABLET | Freq: Every day | ORAL | 2 refills | Status: DC

## 2017-11-21 NOTE — Assessment & Plan Note (Signed)
BP is improved but still not at goal. Will continue Norvasc and add HCTZ. He has tolerated this in the past. Notes that some have trouble with ED once being place on anti-hypertensives and if this is a problem for him, we will address.

## 2017-12-08 ENCOUNTER — Encounter: Payer: Self-pay | Admitting: Family Medicine

## 2018-01-28 ENCOUNTER — Other Ambulatory Visit: Payer: Self-pay | Admitting: Urgent Care

## 2018-01-28 NOTE — Telephone Encounter (Signed)
Wallis Bamberg is provider on medication list, but there is no record of him seeing pt.

## 2018-02-10 ENCOUNTER — Encounter: Payer: BLUE CROSS/BLUE SHIELD | Admitting: Family Medicine

## 2018-02-21 ENCOUNTER — Other Ambulatory Visit: Payer: Self-pay | Admitting: Urgent Care

## 2018-09-29 ENCOUNTER — Other Ambulatory Visit: Payer: Self-pay | Admitting: Family Medicine

## 2018-09-29 DIAGNOSIS — I10 Essential (primary) hypertension: Secondary | ICD-10-CM

## 2019-11-11 ENCOUNTER — Other Ambulatory Visit: Payer: Self-pay | Admitting: *Deleted

## 2019-11-11 DIAGNOSIS — I10 Essential (primary) hypertension: Secondary | ICD-10-CM

## 2019-11-14 MED ORDER — AMLODIPINE BESYLATE 10 MG PO TABS: 10.0000 mg | ORAL_TABLET | Freq: Every day | ORAL | 2 refills | Status: AC

## 2021-10-23 ENCOUNTER — Encounter: Payer: Self-pay | Admitting: *Deleted
